# Patient Record
Sex: Female | Born: 1970 | Race: White | Hispanic: No | Marital: Married | Smoking: Never smoker
Health system: Southern US, Community
[De-identification: ages and names within clinical notes are randomized; demographics above are authoritative.]

---

## 2014-01-25 ENCOUNTER — Ambulatory Visit: Payer: Self-pay | Admitting: Obstetrics & Gynecology

## 2014-01-25 LAB — COMPREHENSIVE METABOLIC PANEL
ALT: 24 U/L
ANION GAP: 8 (ref 7–16)
Albumin: 3.2 g/dL — ABNORMAL LOW (ref 3.4–5.0)
Alkaline Phosphatase: 80 U/L
BILIRUBIN TOTAL: 0.3 mg/dL (ref 0.2–1.0)
BUN: 11 mg/dL (ref 7–18)
CALCIUM: 9.1 mg/dL (ref 8.5–10.1)
CO2: 30 mmol/L (ref 21–32)
CREATININE: 0.97 mg/dL (ref 0.60–1.30)
Chloride: 102 mmol/L (ref 98–107)
EGFR (Non-African Amer.): >60
Glucose: 104 mg/dL — ABNORMAL HIGH (ref 65–99)
OSMOLALITY: 279 (ref 275–301)
Potassium: 3.5 mmol/L (ref 3.5–5.1)
SGOT(AST): 19 U/L (ref 15–37)
Sodium: 140 mmol/L (ref 136–145)
Total Protein: 6.8 g/dL (ref 6.4–8.2)

## 2014-01-25 LAB — CBC
HCT: 39 % (ref 35.0–47.0)
HGB: 12.7 g/dL (ref 12.0–16.0)
MCH: 30.8 pg (ref 26.0–34.0)
MCHC: 32.6 g/dL (ref 32.0–36.0)
MCV: 94 fL (ref 80–100)
PLATELETS: 429 10*3/uL (ref 150–440)
RBC: 4.13 10*6/uL (ref 3.80–5.20)
RDW: 13.7 % (ref 11.5–14.5)
WBC: 9.4 10*3/uL (ref 3.6–11.0)

## 2014-01-25 LAB — LIPASE, BLOOD: Lipase: 175 U/L (ref 73–393)

## 2014-05-31 NOTE — Op Note (Signed)
PATIENT NAME:  Tara Ortega, [name] MR#:  [id number] DATE OF BIRTH:  10-29-1970  DATE OF PROCEDURE:  01/25/2014  PREOPERATIVE DIAGNOSIS:  Vaginal bleeding and vaginal cuff separation.  POSTOPERATIVE DIAGNOSIS:  Vaginal bleeding and vaginal cuff separation.  PROCEDURE: Exam under anesthesia and repair of vaginal cuff.   SURGEON:  Annamarie MajorPaul Sabrinna Yearwood, MD    ANESTHESIA: General.   ESTIMATED BLOOD LOSS: Minimal.   COMPLICATIONS: None.   FINDINGS: Mucosal separation at the vaginal apex with no evidence for fascial dehiscence.   DISPOSITION: To recovery room in stable condition.   TECHNIQUE: The patient is prepped and draped in the usual sterile fashion. After adequate anesthesia is obtained in the dorsal lithotomy position, speculum was placed and the vaginal cuff is identified. Sutures from prior surgery are seen in place. However, one seems to be overlapping vaginal tissues with bleeding from behind it as the vaginal tissues in this stitch area are not approximating the mucosal edges.  The stitch is cut and the defect in the back of the vaginal apex is visualized.  There is no fascial dehiscence or evisceration noted.  The vaginal cuff edges are approximated with a 0-Vicryl suture in a running locking fashion to completely close this defect. No bleeding is noted at the conclusion of this part of the procedure.  The vaginal cavity is irrigated with aspiration of fluid, and continued hemostasis is noted.  The patient goes to the recovery room in stable condition.  All sponge, instrument, and needle counts are correct.     ____________________________ R. Annamarie MajorPaul Cillian Gwinner, MD rph:DT D: 01/25/2014 13:28:08 ET T: 01/25/2014 14:18:45 ET JOB#: [id number]  cc: Dierdre Searles. Paul Srihan Brutus, MD, <Dictator> Nadara MustardOBERT P Berlene Dixson MD ELECTRONICALLY SIGNED 01/28/2014 9:38

## 2015-08-30 ENCOUNTER — Emergency Department: Payer: BLUE CROSS/BLUE SHIELD

## 2015-08-30 ENCOUNTER — Observation Stay
Admission: EM | Admit: 2015-08-30 | Discharge: 2015-08-31 | Disposition: A | Discharge status: Home / Self Care | Payer: BLUE CROSS/BLUE SHIELD | Attending: Internal Medicine | Admitting: Internal Medicine

## 2015-08-30 ENCOUNTER — Encounter: Payer: Self-pay | Admitting: Emergency Medicine

## 2015-08-30 DIAGNOSIS — Z9049 Acquired absence of other specified parts of digestive tract: Secondary | ICD-10-CM | POA: Diagnosis not present

## 2015-08-30 DIAGNOSIS — I10 Essential (primary) hypertension: Secondary | ICD-10-CM

## 2015-08-30 DIAGNOSIS — R079 Chest pain, unspecified: Principal | ICD-10-CM | POA: Diagnosis present

## 2015-08-30 DIAGNOSIS — E876 Hypokalemia: Secondary | ICD-10-CM

## 2015-08-30 DIAGNOSIS — E785 Hyperlipidemia, unspecified: Secondary | ICD-10-CM | POA: Insufficient documentation

## 2015-08-30 DIAGNOSIS — Z79899 Other long term (current) drug therapy: Secondary | ICD-10-CM | POA: Diagnosis not present

## 2015-08-30 DIAGNOSIS — Z6841 Body Mass Index (BMI) 40.0 and over, adult: Secondary | ICD-10-CM | POA: Diagnosis not present

## 2015-08-30 DIAGNOSIS — Z7982 Long term (current) use of aspirin: Secondary | ICD-10-CM | POA: Insufficient documentation

## 2015-08-30 DIAGNOSIS — D72829 Elevated white blood cell count, unspecified: Secondary | ICD-10-CM | POA: Diagnosis not present

## 2015-08-30 DIAGNOSIS — E669 Obesity, unspecified: Secondary | ICD-10-CM | POA: Diagnosis not present

## 2015-08-30 DIAGNOSIS — R51 Headache: Secondary | ICD-10-CM | POA: Insufficient documentation

## 2015-08-30 DIAGNOSIS — Z8249 Family history of ischemic heart disease and other diseases of the circulatory system: Secondary | ICD-10-CM | POA: Insufficient documentation

## 2015-08-30 DIAGNOSIS — R519 Headache, unspecified: Secondary | ICD-10-CM

## 2015-08-30 LAB — BASIC METABOLIC PANEL
ANION GAP: 9 (ref 5–15)
BUN: 10 mg/dL (ref 6–20)
CALCIUM: 9.7 mg/dL (ref 8.9–10.3)
CO2: 26 mmol/L (ref 22–32)
Chloride: 102 mmol/L (ref 101–111)
Creatinine, Ser: 0.9 mg/dL (ref 0.44–1.00)
GFR calc Af Amer: >60 mL/min (ref >60)
GLUCOSE: 113 mg/dL — AB (ref 65–99)
POTASSIUM: 3.3 mmol/L — AB (ref 3.5–5.1)
SODIUM: 137 mmol/L (ref 135–145)

## 2015-08-30 LAB — CBC
HEMATOCRIT: 38.9 % (ref 35.0–47.0)
HEMOGLOBIN: 13.3 g/dL (ref 12.0–16.0)
MCH: 31.1 pg (ref 26.0–34.0)
MCHC: 34.2 g/dL (ref 32.0–36.0)
MCV: 91.1 fL (ref 80.0–100.0)
Platelets: 399 10*3/uL (ref 150–440)
RBC: 4.27 MIL/uL (ref 3.80–5.20)
RDW: 15.3 % — AB (ref 11.5–14.5)
WBC: 11.3 10*3/uL — AB (ref 3.6–11.0)

## 2015-08-30 LAB — TROPONIN I

## 2015-08-30 MED ORDER — ACETAMINOPHEN 325 MG PO TABS
650.0000 mg | ORAL_TABLET | Freq: Once | ORAL | Status: AC
  Administered 2015-08-30: 650 mg via ORAL
  Filled 2015-08-30: qty 2

## 2015-08-30 NOTE — ED Notes (Signed)
Pt c/o of n/v beginning yesterday. Pt reports 1 whitish emesis at 1800 yesterday, and one this evening at approx 2000.  Pt c/o of left sided chest pain (now resolved) that radiated down left arm and neck with accompanying symptoms of dizziness/lightheadedess, weakness, SOB, N/V, diaphoresis. Pt describes the pain as squeezing and rated it as a 10 out of 10.   Pt reports hx of sharp shooting right-sided chest pain, however believes it to have been gas pain.

## 2015-08-30 NOTE — ED Notes (Signed)
Patient transported to X-ray 

## 2015-08-30 NOTE — ED Notes (Signed)
Pt reports she took 162 mg of aspirin at 2000

## 2015-08-30 NOTE — ED Provider Notes (Signed)
[facility] Emergency Department Provider Note   ____________________________________________  Time seen: Approximately 10:20 PM  I have reviewed the triage vital signs and the nursing notes.   HISTORY  Chief Complaint Chest Pain    HPI Tara Ortega is a 45 y.o. female with a history of hypertension presenting with left-sided chest pain. She s that the pain started at about 6:30 while she was cooking. She says the pain lasts about 15-20 minutes and was radiating up the left side of her neck. She became diaphoretic and also vomited. She denies smoking. Says that she does have a family history of heart disease. Took 2 baby aspirin prior to arrival this evening. Says had a similar episode yesterday. Has never had a stress test.has been taking Bactrim now for over a week without issue. Denies any abdominal pain.   No past medical history on file.  There are no active problems to display for this patient.   No past surgical history on file.  Current Outpatient Rx  . Order #: [id number] Class: Historical Med    Allergies Review of patient's allergies indicates no known allergies.  No family history on file.  Social History Social History  Substance Use Topics  . Smoking status: Not on file  . Smokeless tobacco: Not on file  . Alcohol use Not on file    Review of Systems Constitutional: No fever/chills Eyes: No visual changes. ENT: No sore throat. Cardiovascular: as above Respiratory: says has had increasing shortness of breath with exertion but says this may be from difficulty walking from the left heel spur surgery several weeks ago. Gastrointestinal: No abdominal pain.  No nausea, no vomiting.  No diarrhea.  No constipation. Genitourinary: Negative for dysuria. Musculoskeletal: Negative for back pain. Skin: Negative for rash. Neurological: Negative for headaches, focal weakness or numbness.  10-point ROS otherwise  negative.  ____________________________________________   PHYSICAL EXAM:  VITAL SIGNS: ED Triage Vitals  Enc Vitals Group     BP 08/30/15 2128 (!) 155/79     Pulse Rate 08/30/15 2128 81     Resp 08/30/15 2128 18     Temp 08/30/15 2128 97.9 F (36.6 C)     Temp Source 08/30/15 2128 Oral     SpO2 08/30/15 2128 98 %     Weight 08/30/15 2128 270 lb (122.5 kg)     Height 08/30/15 2128 5\' 8"  (1.727 m)     Head Circumference --      Peak Flow --      Pain Score 08/30/15 2130 8     Pain Loc --      Pain Edu? --      Excl. in GC? --     Constitutional: Alert and oriented. Well appearing and in no acute distress. Eyes: Conjunctivae are normal. PERRL. EOMI. Head: Atraumatic. Nose: No congestion/rhinnorhea. Mouth/Throat: Mucous membranes are moist. Neck: No stridor.   Cardiovascular: Normal rate, regular rhythm. Grossly normal heart sounds.  Good peripheral circulation with equal and bilateral radial as well as dorsalis pedis pulses. Chest pain is not reproducible to palpation. Respiratory: Normal respiratory effort.  No retractions. Lungs CTAB. Gastrointestinal: Soft and nontender. No distention. No abdominal bruits.  Musculoskeletal: No lower extremity tenderness nor edema.  No joint effusions. Neurologic:  Normal speech and language. No gross focal neurologic deficits are appreciated. Skin:  Skin is warm, dry and intact. No rash noted. Psychiatric: Mood and affect are normal. Speech and behavior are normal.  ____________________________________________   LABS (all  labs ordered are listed, but only abnormal results are displayed)  Labs Reviewed  BASIC METABOLIC PANEL - Abnormal; Notable for the following:       Result Value   Potassium 3.3 (*)    Glucose, Bld 113 (*)    All other components within normal limits  CBC - Abnormal; Notable for the following:    WBC 11.3 (*)    RDW 15.3 (*)    All other components within normal limits  TROPONIN I    ____________________________________________  EKG  ED ECG REPORT I, Arelia Longest, the attending physician, personally viewed and interpreted this ECG.   Date: 08/30/2015  EKG Time: 2121  Rate: 77  Rhythm: normal sinus rhythm  Axis: normal axis  Intervals:none  ST&T Change: no ST segment elevation or depression. T-wave inversions in3 and aVF as well as V3 nd V4.  ____________________________________________  RADIOLOGY  CLINICAL DATA:  45 year old female with chest pain EXAM: CHEST  2 VIEW COMPARISON:  None. FINDINGS: The heart size and mediastinal contours are within normal limits. Both lungs are clear. The visualized skeletal structures are unremarkable. Right upper quadrant cholecystectomy clips. IMPRESSION: No active cardiopulmonary disease. Electronically Signed   By: Elgie Collard M.D.   On: 08/30/2015 22:01 ____________________________________________   PROCEDURES    Procedures    ____________________________________________   INITIAL IMPRESSION / ASSESSMENT AND PLAN / ED COURSE  Pertinent labs & imaging results that were available during my care of the patient were reviewed by me and considered in my medical decision making (see chart for details).  Patient chest pain-free at this time. Says she has a headache that is diffusebut started after the episode. Patient with a heart score of 4. PERC negative.  Discussed plan with patient and she would prefer to be admitted for observation and possible further testing. ____________________________________________   FINAL CLINICAL IMPRESSION(S) / ED DIAGNOSES  Chest pain.    NEW MEDICATIONS STARTED DURING THIS VISIT:  New Prescriptions   No medications on file     Note:  This document was prepared using Dragon voice recognition software and may include unintentional dictation errors.    Myrna Blazer, MD 08/30/15 2232

## 2015-08-30 NOTE — ED Triage Notes (Signed)
Pt states that she was cooking and started having center chest pain that radiated up the side of her neck and is now in her head. Pt also states that she had ankle surgery for bone spurs recently and states that it is infected, she is currently taking a sulfa antibiotic and has a follow up appt tomorrow

## 2015-08-31 ENCOUNTER — Observation Stay: Payer: BLUE CROSS/BLUE SHIELD

## 2015-08-31 DIAGNOSIS — R51 Headache: Secondary | ICD-10-CM

## 2015-08-31 DIAGNOSIS — E876 Hypokalemia: Secondary | ICD-10-CM

## 2015-08-31 DIAGNOSIS — I10 Essential (primary) hypertension: Secondary | ICD-10-CM

## 2015-08-31 DIAGNOSIS — E669 Obesity, unspecified: Secondary | ICD-10-CM

## 2015-08-31 DIAGNOSIS — E785 Hyperlipidemia, unspecified: Secondary | ICD-10-CM

## 2015-08-31 DIAGNOSIS — D72829 Elevated white blood cell count, unspecified: Secondary | ICD-10-CM

## 2015-08-31 DIAGNOSIS — R519 Headache, unspecified: Secondary | ICD-10-CM

## 2015-08-31 LAB — LIPID PANEL
Cholesterol: 184 mg/dL (ref 0–200)
HDL: 41 mg/dL (ref >40)
LDL CALC: 107 mg/dL — AB (ref 0–99)
Total CHOL/HDL Ratio: 4.5 RATIO
Triglycerides: 178 mg/dL — ABNORMAL HIGH (ref <150)
VLDL: 36 mg/dL (ref 0–40)

## 2015-08-31 LAB — NM MYOCAR MULTI W/SPECT W/WALL MOTION / EF
CSEPED: 1 min
CSEPEDS: 8 s
Estimated workload: 1 METS
Peak HR: 129 {beats}/min
Rest HR: 76 {beats}/min

## 2015-08-31 LAB — TROPONIN I
Troponin I: <0.03 ng/mL (ref <0.03)
Troponin I: <0.03 ng/mL (ref <0.03)
Troponin I: <0.03 ng/mL (ref <0.03)

## 2015-08-31 LAB — POTASSIUM: Potassium: 3.7 mmol/L (ref 3.5–5.1)

## 2015-08-31 LAB — FIBRIN DERIVATIVES D-DIMER (ARMC ONLY): Fibrin derivatives D-dimer (ARMC): 215 (ref 0–499)

## 2015-08-31 LAB — TSH: TSH: 3.936 u[IU]/mL (ref 0.350–4.500)

## 2015-08-31 LAB — HEMOGLOBIN A1C: HEMOGLOBIN A1C: 5.4 % (ref 4.0–6.0)

## 2015-08-31 LAB — MAGNESIUM: Magnesium: 1.9 mg/dL (ref 1.7–2.4)

## 2015-08-31 MED ORDER — ACETAMINOPHEN 325 MG PO TABS
650.0000 mg | ORAL_TABLET | ORAL | Status: DC | PRN
  Administered 2015-08-31: 650 mg via ORAL
  Filled 2015-08-31: qty 2

## 2015-08-31 MED ORDER — TECHNETIUM TC 99M TETROFOSMIN IV KIT
32.2300 | PACK | Freq: Once | INTRAVENOUS | Status: AC | PRN
  Administered 2015-08-31: 32.23 via INTRAVENOUS

## 2015-08-31 MED ORDER — SULFAMETHOXAZOLE-TRIMETHOPRIM 800-160 MG PO TABS
1.0000 | ORAL_TABLET | Freq: Two times a day (BID) | ORAL | Status: DC
  Administered 2015-08-31 (×2): 1 via ORAL
  Filled 2015-08-31 (×2): qty 1

## 2015-08-31 MED ORDER — POTASSIUM CHLORIDE CRYS ER 20 MEQ PO TBCR
40.0000 meq | EXTENDED_RELEASE_TABLET | Freq: Two times a day (BID) | ORAL | Status: AC
  Administered 2015-08-31 (×2): 40 meq via ORAL
  Filled 2015-08-31 (×2): qty 2

## 2015-08-31 MED ORDER — MORPHINE SULFATE (PF) 2 MG/ML IV SOLN: 2.0000 mg | INTRAVENOUS | Status: DC | PRN

## 2015-08-31 MED ORDER — AMLODIPINE BESYLATE 5 MG PO TABS: 5.0000 mg | ORAL_TABLET | Freq: Every day | ORAL | Status: DC

## 2015-08-31 MED ORDER — REGADENOSON 0.4 MG/5ML IV SOLN
0.4000 mg | Freq: Once | INTRAVENOUS | Status: AC
  Administered 2015-08-31: 0.4 mg via INTRAVENOUS

## 2015-08-31 MED ORDER — ONDANSETRON HCL 4 MG/2ML IJ SOLN: 4.0000 mg | Freq: Four times a day (QID) | INTRAMUSCULAR | Status: DC | PRN

## 2015-08-31 MED ORDER — TECHNETIUM TC 99M TETROFOSMIN IV KIT
13.5700 | PACK | Freq: Once | INTRAVENOUS | Status: AC | PRN
  Administered 2015-08-31: 13.67 via INTRAVENOUS

## 2015-08-31 MED ORDER — AMLODIPINE BESYLATE 10 MG PO TABS: 10.0000 mg | ORAL_TABLET | Freq: Every day | ORAL | 5 refills | Status: DC

## 2015-08-31 MED ORDER — ASPIRIN EC 81 MG PO TBEC
81.0000 mg | DELAYED_RELEASE_TABLET | Freq: Every day | ORAL | Status: DC
  Administered 2015-08-31: 81 mg via ORAL
  Filled 2015-08-31: qty 1

## 2015-08-31 MED ORDER — ZOLPIDEM TARTRATE 5 MG PO TABS: 5.0000 mg | ORAL_TABLET | Freq: Every evening | ORAL | Status: DC | PRN

## 2015-08-31 MED ORDER — SODIUM CHLORIDE 0.9% FLUSH
3.0000 mL | Freq: Two times a day (BID) | INTRAVENOUS | Status: DC
  Administered 2015-08-31 (×2): 3 mL via INTRAVENOUS

## 2015-08-31 MED ORDER — FLUOXETINE HCL 20 MG PO CAPS
40.0000 mg | ORAL_CAPSULE | Freq: Every day | ORAL | Status: DC
  Administered 2015-08-31: 40 mg via ORAL
  Filled 2015-08-31: qty 2

## 2015-08-31 MED ORDER — HYDROCHLOROTHIAZIDE 25 MG PO TABS
25.0000 mg | ORAL_TABLET | Freq: Every day | ORAL | Status: DC
  Administered 2015-08-31: 25 mg via ORAL
  Filled 2015-08-31: qty 1

## 2015-08-31 MED ORDER — AMLODIPINE BESYLATE 10 MG PO TABS
10.0000 mg | ORAL_TABLET | Freq: Every day | ORAL | Status: DC
  Administered 2015-08-31: 10 mg via ORAL
  Filled 2015-08-31: qty 1

## 2015-08-31 MED ORDER — ALPRAZOLAM 0.25 MG PO TABS: 0.2500 mg | ORAL_TABLET | Freq: Two times a day (BID) | ORAL | Status: DC | PRN

## 2015-08-31 NOTE — Consult Note (Signed)
[facility] CLINIC CARDIOLOGY [facility]  CARDIOLOGY CONSULT NOTE  Patient ID: Tara Ortega MRN: [id number] DOB/AGE: [birth year] 45 y.o.  Admit date: 08/30/2015 Referring Physician Dr. Winona Legato Primary Physician   Primary Cardiologist None Reason for Consultation chest pain  HPI: Pt is a 45 yo female with no significant prior cardiac history admitted with complaints of chesst pain. She has ruled out for an mi. She states she had 2 episodes of chest pain lasting 15-20 minutes while she was cooking. Associated with diaphoresis, nausea and vomiting. She had a previous sinilar episode on the right side of her body earlier. She is taking bactrim currenly. She underwent a functional study this am which was normal. She has a normal ef. She is on amlodipine and hctz for hypertension. She has a family histgory of cad. D dimer was normal.   Review of Systems  HENT: Negative.   Eyes: Negative.   Respiratory: Negative.   Cardiovascular: Positive for chest pain.  Gastrointestinal: Positive for nausea and vomiting.  Genitourinary: Negative.   Musculoskeletal: Negative.   Skin: Negative.   Neurological: Positive for weakness.  Endo/Heme/Allergies: Negative.   Psychiatric/Behavioral: Negative.     History reviewed. No pertinent past medical history.  History reviewed. No pertinent family history.  Social History   Social History  . Marital status: Married    Spouse name: N/A  . Number of children: N/A  . Years of education: N/A   Occupational History  . Not on file.   Social History Main Topics  . Smoking status: Never Smoker  . Smokeless tobacco: Never Used  . Alcohol use No  . Drug use: No  . Sexual activity: Not on file   Other Topics Concern  . Not on file   Social History Narrative  . No narrative on file    History reviewed. No pertinent surgical history.   Prescriptions Prior to Admission  Medication Sig Dispense Refill Last Dose  . amLODipine (NORVASC) 5  MG tablet Take 5 mg by mouth daily.   08/30/2015 at Unknown time  . FLUoxetine (PROZAC) 40 MG capsule Take 40 mg by mouth daily.   08/30/2015 at Unknown time  . hydrochlorothiazide (HYDRODIURIL) 25 MG tablet Take 25 mg by mouth daily.   08/30/2015 at Unknown time  . sulfamethoxazole-trimethoprim (BACTRIM DS,SEPTRA DS) 800-160 MG tablet Take 1 tablet by mouth 2 (two) times daily.   08/30/2015 at Unknown time    Physical Exam: Blood pressure (!) 144/78, pulse 78, temperature 98.4 F (36.9 C), temperature source Oral, resp. rate 18, height 5\' 8"  (1.727 m), weight 122.5 kg (270 lb), SpO2 97 %.   Wt Readings from Last 1 Encounters:  08/30/15 122.5 kg (270 lb)     General appearance: alert and cooperative Head: Normocephalic, without obvious abnormality, atraumatic Resp: clear to auscultation bilaterally Chest wall: no tenderness Cardio: regular rate and rhythm, S1, S2 normal, no murmur, click, rub or gallop GI: soft, non-tender; bowel sounds normal; no masses,  no organomegaly Extremities: extremities normal, atraumatic, no cyanosis or edema Neurologic: Grossly normal  Labs:   Lab Results  Component Value Date   WBC 11.3 (H) 08/30/2015   HGB 13.3 08/30/2015   HCT 38.9 08/30/2015   MCV 91.1 08/30/2015   PLT 399 08/30/2015    Recent Labs Lab 08/30/15 2130 08/31/15 1248  NA 137  --   K 3.3* 3.7  CL 102  --   CO2 26  --   BUN 10  --   CREATININE  0.90  --   CALCIUM 9.7  --   GLUCOSE 113*  --    Lab Results  Component Value Date   TROPONINI <0.03 08/31/2015      Radiology: no acute cardiopulmonary disease EKG: nsr with no ischemia  ASSESSMENT AND PLAN:  Pt with recent admission for chest pain with both typcial and atypical features who has ruled out for an mi. EKG is unremarkable. Funcitonal study appears normal. D dimer was normal. Does not appear to have acs. Would continue with amlodipine 10 mg daily and hctz. Currently stable but still with chest pain. Does not apeart to  require furhter inpatient cardiac workup. Ambulate.  Signed: Dalia Heading MD, [facility] 08/31/2015, 2:26 PM

## 2015-08-31 NOTE — Discharge Summary (Addendum)
[facility] [place] [facility]   PATIENT NAME: Tara Ortega    MR#:  [id number]  DATE OF BIRTH:  12-10-1970  DATE OF ADMISSION:  08/30/2015 ADMITTING PHYSICIAN: Delfino Lovett, MD  DATE OF DISCHARGE: 08/31/2015  4:16 PM  PRIMARY CARE PHYSICIAN: Belva Agee, NP     ADMISSION DIAGNOSIS:  Chest pain, unspecified chest pain type [R07.9]  DISCHARGE DIAGNOSIS:  Principal Problem:   Chest pain, rule out acute myocardial infarction Active Problems:   Benign essential HTN   Hypokalemia   Hyperlipidemia   Obesity   Headache   Leukocytosis   SECONDARY DIAGNOSIS:  History reviewed. No pertinent past medical history.  .pro HOSPITAL COURSE:   The patient is 45 year old female with PMH of essential hypertension, who presented with cheat pain, headache. Her EKG revealed T inversions in III, aVF, V3, V4. The patient was admitted and underwent stress test, which was normal. She was also seen by cardiologist and no further work up was recommended. The patient's blood pressure was poorly controled, so Norvasc was advanced to 10 mg dose. She was also recommended to get sleep stuydy done to r/o OSA. She was felt to be stable to be discharged home. BNP was normal.  Discussion by problem: 1. Chest pain, could likely be related to poorly conrtoled hyeprtsnkion, Myoview was negative, BNP was  Normal, the patient was  not hypoxic. 2. Headache, again, likely HTN related, follow, also get sleep study if no improvement to r/o OSA and initiate patient on diamox for possible pseudotumor cerebri if all negative . 3. Hypokalemia, resolved with PO supplementation 4. Obesity, TSH was normal, hgb a1c 5.4, no diabetes, Lipid panel showed triglycerides of 178, LDL107, needs to loose weight  5. Leukocytosis, no obvious infection, continue home antibiotics as per prior recommendations 6 essential HTn, advanced norvasc to 10 mg dose, continue HCTZ, follow ans advance as  needed DISCHARGE CONDITIONS:   stable  CONSULTS OBTAINED:  Treatment Team:  Dalia Heading, MD  DRUG ALLERGIES:  No Known Allergies  DISCHARGE MEDICATIONS:   Discharge Medication List as of 08/31/2015  3:50 PM    CONTINUE these medications which have CHANGED   Details  amLODipine (NORVASC) 10 MG tablet Take 1 tablet (10 mg total) by mouth daily., Starting Mon 08/31/2015, Normal      CONTINUE these medications which have NOT CHANGED   Details  FLUoxetine (PROZAC) 40 MG capsule Take 40 mg by mouth daily., Starting Thu 07/16/2015, Historical Med    hydrochlorothiazide (HYDRODIURIL) 25 MG tablet Take 25 mg by mouth daily., Starting Thu 07/16/2015, Historical Med    sulfamethoxazole-trimethoprim (BACTRIM DS,SEPTRA DS) 800-160 MG tablet Take 1 tablet by mouth 2 (two) times daily., Historical Med      STOP taking these medications     aspirin 81 MG tablet          DISCHARGE INSTRUCTIONS:    The patient is to follow up with PCP  If you experience worsening of your admission symptoms, develop shortness of breath, life threatening emergency, suicidal or homicidal thoughts you must seek medical attention immediately by calling 911 or calling your MD immediately  if symptoms less severe.  You Must read complete instructions/literature along with all the possible adverse reactions/side effects for all the Medicines you take and that have been prescribed to you. Take any new Medicines after you have completely understood and accept all the possible adverse reactions/side effects.   Please note  You were cared for  by a hospitalist during your hospital stay. If you have any questions about your discharge medications or the care you received while you were in the hospital after you are discharged, you can call the unit and asked to speak with the hospitalist on call if the hospitalist that took care of you is not available. Once you are discharged, your primary care physician will handle  any further medical issues. Please note that NO REFILLS for any discharge medications will be authorized once you are discharged, as it is imperative that you return to your primary care physician (or establish a relationship with a primary care physician if you do not have one) for your aftercare needs so that they can reassess your need for medications and monitor your lab values.    Today   CHIEF COMPLAINT:   Chief Complaint  Patient presents with  . Chest Pain    HISTORY OF PRESENT ILLNESS:  Tara Ortega  is a 45 y.o. female with a known history of essential hypertension, who presented with cheat pain, headache. Her EKG revealed T inversions in III, aVF, V3, V4. The patient was admitted and underwent stress test, which was normal. She was also seen by cardiologist and no further work up was recommended. The patient's blood pressure was poorly controled, so Norvasc was advanced to 10 mg dose. She was also recommended to get sleep stuydy done to r/o OSA. She was felt to be stable to be discharged home. BNP was normal.  Discussion by problem: 1. Chest pain, could likely be related to poorly conrtoled hyeprtsnkion, Myoview was negative, BNP was  Normal, the patient was  not hypoxic. 2. Headache, again, likely HTN related, follow, also get sleep study if no improvement to r/o OSA and initiate patient on diamox for possible pseudotumor cerebri if all negative . 3. Hypokalemia, resolved with PO supplementation 4. Obesity, TSH was normal, hgb a1c 5.4, no diabetes,  Lipid panel showed triglycerides of 178, LDL107, needs to loose weight 5. Leukocytosis, no obvious infection, continue home antibiotics as per prior recommendations 6 essential HTn, advanced norvasc to 10 mg dose, continue HCTZ, follow ans advance as needed  VITAL SIGNS:  Blood pressure (!) 144/78, pulse 78, temperature 98.4 F (36.9 C), temperature source Oral, resp. rate 18, height 5\' 8"  (1.727 m), weight 122.5 kg (270 lb), SpO2  97 %.  I/O:   Intake/Output Summary (Last 24 hours) at 08/31/15 1632 Last data filed at 08/31/15 0022  Gross per 24 hour  Intake                0 ml  Output              300 ml  Net             -300 ml    PHYSICAL EXAMINATION:  GENERAL:  45 y.o.-year-old patient lying in the bed with no acute distress.  EYES: Pupils equal, round, reactive to light and accommodation. No scleral icterus. Extraocular muscles intact.  HEENT: Head atraumatic, normocephalic. Oropharynx and nasopharynx clear.  NECK:  Supple, no jugular venous distention. No thyroid enlargement, no tenderness.  LUNGS: Normal breath sounds bilaterally, no wheezing, rales,rhonchi or crepitation. No use of accessory muscles of respiration.  CARDIOVASCULAR: S1, S2 normal. No murmurs, rubs, or gallops.  ABDOMEN: Soft, non-tender, non-distended. Bowel sounds present. No organomegaly or mass.  EXTREMITIES: No pedal edema, cyanosis, or clubbing.  NEUROLOGIC: Cranial nerves II through XII are intact. Muscle strength 5/5 in all extremities. Sensation  intact. Gait not checked.  PSYCHIATRIC: The patient is alert and oriented x 3.  SKIN: No obvious rash, lesion, or ulcer.   DATA REVIEW:   CBC  Recent Labs Lab 08/30/15 2130  WBC 11.3*  HGB 13.3  HCT 38.9  PLT 399    Chemistries   Recent Labs Lab 08/30/15 2130 08/31/15 0351 08/31/15 1248  NA 137  --   --   K 3.3*  --  3.7  CL 102  --   --   CO2 26  --   --   GLUCOSE 113*  --   --   BUN 10  --   --   CREATININE 0.90  --   --   CALCIUM 9.7  --   --   MG  --  1.9  --     Cardiac Enzymes  Recent Labs Lab 08/31/15 0643  TROPONINI <0.03    Microbiology Results  No results found for this or any previous visit.  RADIOLOGY:  Dg Chest 2 View  Result Date: 08/30/2015 CLINICAL DATA:  45 year old female with chest pain EXAM: CHEST  2 VIEW COMPARISON:  None. FINDINGS: The heart size and mediastinal contours are within normal limits. Both lungs are clear. The  visualized skeletal structures are unremarkable. Right upper quadrant cholecystectomy clips. IMPRESSION: No active cardiopulmonary disease. Electronically Signed   By: Elgie Collard M.D.   On: 08/30/2015 22:01  Nm Myocar Multi W/spect W/wall Motion / Ef  Result Date: 08/31/2015 Dalia Heading, MD     08/31/2015  2:49 PM Stress sestimibi showed no evidence of ischemia. Normal lv function.    EKG:   Orders placed or performed during the hospital encounter of 08/30/15  . EKG 12-Lead  . EKG 12-Lead  . ED EKG within 10 minutes  . ED EKG within 10 minutes  . EKG 12-Lead (at 6am)  . EKG 12-Lead (at 6am)      Management plans discussed with the patient, family and they are in agreement.  CODE STATUS:     Code Status Orders        Start     Ordered   08/31/15 0019  Full code  Continuous     08/31/15 0018    Code Status History    Date Active Date Inactive Code Status Order ID Comments User Context   This patient has a current code status but no historical code status.      TOTAL TIME TAKING CARE OF THIS PATIENT: 40 minutes.    Katharina Caper M.D on 08/31/2015 at 4:32 PM  Between 7am to 6pm - Pager - [phone]  After 6pm go to [email or web address] - password [email or web address] [facility]  [facility]  Office  [phone]  CC: Primary care physician; Belva Agee, NP

## 2015-08-31 NOTE — Procedures (Signed)
Stress sestimibi showed no evidence of ischemia. Normal lv function.

## 2015-08-31 NOTE — ED Notes (Signed)
Pt had plane flight from [place] on Thursday

## 2015-08-31 NOTE — Progress Notes (Signed)
No reports of chest pain.  No respiratory distress noted with ambulation to bathroom.  Complaint of headache improved with Tylenol

## 2015-08-31 NOTE — H&P (Addendum)
Tara Ortega is an 45 y.o. female.   Chief Complaint: Chest Pain HPI: Tara Ortega is a 45 y.o. female with a history of hypertension presenting with 2 episodes of squeezing left-sided chest pain that started about 6:30pm and lasted 15-20 minutes while she was cooking. Pain was associated with diaphoresis, nausea, vomiting x1 and shortness of breath. She says "it feels like someone was squeezing my heart muscle." Took 2 baby aspirin prior to arrival this evening. Says had a similar episode yesterday and has had similar pain on the right side in the past.  Of note, she flew home from [place] on a 5 hour flight 3 days ago and noted swelling and pain in the left ankle which resolved.  She is taking Bactrim now for a left foot infection for over a week without issue.  Has never had a cardiac workup, echo, stress test.. Denies any abdominal pain.  PMH: HTN  No past surgical history on file.  Fam History: Mother with CHF, Father died of cancer, also had CABGx3 vessel.   Social History: Nonsmoker, nondrinker.   Allergies: No Known Allergies  Medications Prior to Admission  Medication Sig Dispense Refill  . amLODipine (NORVASC) 5 MG tablet Take 5 mg by mouth daily.    Marland Kitchen FLUoxetine (PROZAC) 40 MG capsule Take 40 mg by mouth daily.    . hydrochlorothiazide (HYDRODIURIL) 25 MG tablet Take 25 mg by mouth daily.    Marland Kitchen sulfamethoxazole-trimethoprim (BACTRIM DS,SEPTRA DS) 800-160 MG tablet Take 1 tablet by mouth 2 (two) times daily.      Results for orders placed or performed during the hospital encounter of 08/30/15 (from the past 48 hour(s))  Basic metabolic panel     Status: Abnormal   Collection Time: 08/30/15  9:30 PM  Result Value Ref Range   Sodium 137 135 - 145 mmol/L   Potassium 3.3 (L) 3.5 - 5.1 mmol/L   Chloride 102 101 - 111 mmol/L   CO2 26 22 - 32 mmol/L   Glucose, Bld 113 (H) 65 - 99 mg/dL   BUN 10 6 - 20 mg/dL   Creatinine, Ser 0.90 0.44 - 1.00 mg/dL   Calcium 9.7 8.9 -  10.3 mg/dL   GFR calc non Af Amer >60 >60 mL/min   GFR calc Af Amer >60 >60 mL/min    Comment: (NOTE) The eGFR has been calculated using the CKD EPI equation. This calculation has not been validated in all clinical situations. eGFR's persistently <60 mL/min signify possible Chronic Kidney Disease.    Anion gap 9 5 - 15  CBC     Status: Abnormal   Collection Time: 08/30/15  9:30 PM  Result Value Ref Range   WBC 11.3 (H) 3.6 - 11.0 K/uL   RBC 4.27 3.80 - 5.20 MIL/uL   Hemoglobin 13.3 12.0 - 16.0 g/dL   HCT 38.9 35.0 - 47.0 %   MCV 91.1 80.0 - 100.0 fL   MCH 31.1 26.0 - 34.0 pg   MCHC 34.2 32.0 - 36.0 g/dL   RDW 15.3 (H) 11.5 - 14.5 %   Platelets 399 150 - 440 K/uL  Troponin I     Status: None   Collection Time: 08/30/15  9:30 PM  Result Value Ref Range   Troponin I <0.03 <0.03 ng/mL   Dg Chest 2 View  Result Date: 08/30/2015 CLINICAL DATA:  45 year old female with chest pain EXAM: CHEST  2 VIEW COMPARISON:  None. FINDINGS: The heart size and mediastinal contours are within normal  limits. Both lungs are clear. The visualized skeletal structures are unremarkable. Right upper quadrant cholecystectomy clips. IMPRESSION: No active cardiopulmonary disease. Electronically Signed   By: Anner Crete M.D.   On: 08/30/2015 22:01   ROS CONSTITUTIONAL: No fever/chills. No fatigue, weakness. No weight gain, no weight loss.  EYES: No blurry or double vision.  ENT: No tinnitus. No postnasal drip. No redness or soreness of the oropharynx.  RESPIRATORY: No cough, no wheeze, no hemoptysis. No dyspnea.  CARDIOVASCULAR: POSITIVE CHEST PAIN. No orthopnea. No palpitations. No syncope.  GASTROINTESTINAL: POSITIVE NAUSEA & VOMITING. No constipation or diarrhea. No abdominal pain. No melena or hematochezia.  GENITOURINARY: No dysuria or hematuria.  ENDOCRINE: No polyuria or nocturia. No heat or cold intolerance.  HEMATOLOGY: No anemia. No bruising. No bleeding.  INTEGUMENTARY: No rashes. No  lesions.  MUSCULOSKELETAL: POSITIVE LLE SWELLING. No arthritis.  No gout.  NEUROLOGIC: No numbness, tingling, weakness or ataxia. No seizure-type activity.  PSYCHIATRIC: No anxiety. No depression. No insomnia.   Blood pressure (!) 147/76, pulse 80, temperature 98.3 F (36.8 C), temperature source Oral, resp. rate 16, height 5' 8"  (1.727 m), weight 122.5 kg (270 lb), SpO2 97 %.   Physical Exam  GENERAL: 45 year old white female patient, well-developed, well-nourished lying in the bed in no acute distress. Mildly anxious. EYES: Pupils equal, round, reactive to light and accommodation. No scleral icterus. Extraocular muscles intact.  HEENT: Head atraumatic, normocephalic. Oropharynx and nasopharynx clear. Mucus membranes moist.  NECK: Supple, full range of motion. No jugular venous distention. No thyroid enlargement, no tenderness, no lymphadenopathy.  CHEST: Normal breath sounds bilaterally, no wheezing, rales, rhonchi or crepitation. No use of accessory muscles of respiration. No reproducible chest wall tenderness.  CARDIOVASCULAR: S1, S2 normal. No murmurs, rubs, or gallops. Cap refill <2 seconds. ABDOMEN: Soft, nontender, nondistended. No rebound, guarding, rigidity. Normoactive bowel sounds present in all four quadrants. No organomegaly or mass.  EXTREMITIES: Full range of motion. No pedal edema, cyanosis, or clubbing.  NEUROLOGIC: Cranial nerves II through XII are grossly intact with no focal sensorimotor deficit. Muscle strength 5/5 in all extremities. Sensation intact. Gait not checked.  PSYCHIATRIC: The patient is alert and oriented x 3. Normal affect, mood, thought content.  SKIN: Warm, dry, and intact without obvious rash, lesion, or ulcer.    EKG: Sinus with T wave inversions in III, aVF, V3 and V4.   Assessment/Plan This is a 45 year old white female with h/o HTN now admitted with  1. Chest pain, rule out ACS - Admit to telemetry for monitoring, serial troponins, lipids, TSH,  cardio consult given EKG changes, family history and presentation. Given recent flight from [place] we will also check a D-Dimer and pursue imaging to rule out VTE if positive.   2. Mild hypokalemia - replace PO.   3. HTN - Continue home medications.  4. Cardiac diet, routine DVT px.  5. Plan of care discussed with the patient, husband, and children at bedside. She expresses understanding of her condition and agrees with plan of care.    [employer], DO 08/31/2015, 12:53 AM

## 2015-08-31 NOTE — ED Notes (Signed)
Called floor to let them know pt on the way 

## 2015-11-19 DIAGNOSIS — R600 Localized edema: Secondary | ICD-10-CM | POA: Insufficient documentation

## 2015-11-19 DIAGNOSIS — F419 Anxiety disorder, unspecified: Secondary | ICD-10-CM | POA: Insufficient documentation

## 2015-11-19 DIAGNOSIS — F32A Depression, unspecified: Secondary | ICD-10-CM | POA: Insufficient documentation

## 2019-07-17 ENCOUNTER — Ambulatory Visit (INDEPENDENT_AMBULATORY_CARE_PROVIDER_SITE_OTHER): Payer: BC Managed Care – PPO

## 2019-07-17 ENCOUNTER — Ambulatory Visit (INDEPENDENT_AMBULATORY_CARE_PROVIDER_SITE_OTHER): Payer: BC Managed Care – PPO | Admitting: Podiatry

## 2019-07-17 ENCOUNTER — Other Ambulatory Visit: Payer: Self-pay

## 2019-07-17 ENCOUNTER — Encounter: Payer: Self-pay | Admitting: Podiatry

## 2019-07-17 ENCOUNTER — Other Ambulatory Visit: Payer: Self-pay | Admitting: Podiatry

## 2019-07-17 DIAGNOSIS — B351 Tinea unguium: Secondary | ICD-10-CM

## 2019-07-17 DIAGNOSIS — M7661 Achilles tendinitis, right leg: Secondary | ICD-10-CM

## 2019-07-17 DIAGNOSIS — M79671 Pain in right foot: Secondary | ICD-10-CM

## 2019-07-17 NOTE — Progress Notes (Signed)
Subjective:   Patient ID: Tara Ortega, female   DOB: 49 y.o.   MRN: [id number]   HPI Patient states she is had a lot of pain in the back of her right heel and had a history of surgery on her left one approximately 5 years ago with what she thinks is the same problem.  Patient is coming in a little bit earlier on this 1 and states it is been present for approximately 8 months and she is obese which is complicating factor does not smoke likes to be active   Review of Systems  All other systems reviewed and are negative.       Objective:  Physical Exam Vitals and nursing note reviewed.  Constitutional:      Appearance: She is well-developed.  Pulmonary:     Effort: Pulmonary effort is normal.  Musculoskeletal:        General: Normal range of motion.  Skin:    General: Skin is warm.  Neurological:     Mental Status: She is alert.     Neurovascular status intact muscle strength found to be adequate range of motion within normal limits with significant equinus condition right over left.  Patient has exquisite discomfort on the posterior lateral aspect of the heel and mild in the central and medial aspect of the heel.  The left shows a scar on the medial side with no indications of other pathology except for enlargement of bone but no pain.  Patient has good digital perfusion well oriented x3     Assessment:  Acute Achilles tendinitis right with inflammation at the insertion mostly lateral slightly central medial side of the insertion     Plan:  H&P reviewed condition and we want to try conservative care before considering surgery due to the extent of recovery and overall health.  I explained injection along with immobilization explaining risk and patient is willing to accept risk.  I did a careful injection of the lateral side 3 mg Dexasone Kenalog keep it away from the central medial I applied a air fracture walker with instructions on usage and patient will be seen back 3 weeks  or earlier if needed.  Patient encouraged to reduced activity over that time  X-rays indicate that there is spurring of the posterior aspect of the right heel of a mild to moderate nature

## 2019-07-17 NOTE — Patient Instructions (Signed)

## 2019-08-07 ENCOUNTER — Other Ambulatory Visit: Payer: Self-pay

## 2019-08-07 ENCOUNTER — Ambulatory Visit (INDEPENDENT_AMBULATORY_CARE_PROVIDER_SITE_OTHER): Payer: BC Managed Care – PPO | Admitting: Podiatry

## 2019-08-07 ENCOUNTER — Encounter: Payer: Self-pay | Admitting: Podiatry

## 2019-08-07 VITALS — Temp 98.2°F

## 2019-08-07 DIAGNOSIS — M7661 Achilles tendinitis, right leg: Secondary | ICD-10-CM

## 2019-08-07 NOTE — Progress Notes (Signed)
Subjective:   Patient ID: Tara Ortega, female   DOB: 49 y.o.   MRN: [id number]   HPI Patient states doing quite a bit better but still having mild discomfort if she does a lot.  Understands obesity is part of the problem she is experiencing    ROS      Objective:  Physical Exam  Neurovascular status intact with patient found to have discomfort in the right posterior heel still present but quite a bit improved from previous visit with inflammation only upon deep palpation     Assessment:  Achilles tendinitis right with inflammation improving with pain still present upon deep palpation     Plan:  H&P reviewed condition recommended continued physical therapy anti-inflammatories ice therapy and gradual reduction of the boot.  Patient is discharged and we did discuss possibility for surgical intervention in future if it should flareup or other modalities or treatments we could consider

## 2019-12-16 ENCOUNTER — Other Ambulatory Visit: Payer: Self-pay

## 2019-12-16 ENCOUNTER — Ambulatory Visit (INDEPENDENT_AMBULATORY_CARE_PROVIDER_SITE_OTHER): Payer: BC Managed Care – PPO

## 2019-12-16 ENCOUNTER — Ambulatory Visit (INDEPENDENT_AMBULATORY_CARE_PROVIDER_SITE_OTHER): Payer: BC Managed Care – PPO | Admitting: Podiatry

## 2019-12-16 ENCOUNTER — Encounter: Payer: Self-pay | Admitting: Podiatry

## 2019-12-16 DIAGNOSIS — M7661 Achilles tendinitis, right leg: Secondary | ICD-10-CM

## 2019-12-16 MED ORDER — MELOXICAM 15 MG PO TABS: 15.0000 mg | ORAL_TABLET | Freq: Every day | ORAL | 2 refills | Status: DC

## 2019-12-16 NOTE — Progress Notes (Signed)
Subjective:   Patient ID: Tara Ortega, female   DOB: 49 y.o.   MRN: [id number]   HPI patient presents stating my heel has really been bothering me and I may need to have surgery like I had on the other one.  States that it got better for a very short period of time but it seems like it is hurting on the other side.  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  At this point I am going to try a lateral injection I did explain the risk of doing this and that I want her to wear the boot and that the chances of rupture always exist.  She is willing to accept risk to try to avoid surgery and I did sterile prep and carefully injected the lateral side stayed away from the Achilles tendon itself 3 mg dexamethasone 5 mg Xylocaine reviewed her x-ray and discussed possibility for surgical intervention in this case.  Reappoint 1 month to reevaluate  X-ray indicates there is slight increase in the size of the spur on the posterior aspect right heel

## 2020-01-16 ENCOUNTER — Ambulatory Visit: Payer: BC Managed Care – PPO | Admitting: Podiatry

## 2020-03-06 ENCOUNTER — Other Ambulatory Visit: Payer: Self-pay | Admitting: Podiatry

## 2020-03-06 NOTE — Telephone Encounter (Signed)
Please advise 

## 2020-04-16 ENCOUNTER — Other Ambulatory Visit: Payer: Self-pay | Admitting: Family

## 2020-04-16 DIAGNOSIS — Q438 Other specified congenital malformations of intestine: Secondary | ICD-10-CM

## 2020-04-23 ENCOUNTER — Ambulatory Visit (INDEPENDENT_AMBULATORY_CARE_PROVIDER_SITE_OTHER): Payer: BC Managed Care – PPO | Admitting: Podiatry

## 2020-04-23 ENCOUNTER — Encounter: Payer: Self-pay | Admitting: Podiatry

## 2020-04-23 ENCOUNTER — Other Ambulatory Visit: Payer: Self-pay

## 2020-04-23 DIAGNOSIS — M7661 Achilles tendinitis, right leg: Secondary | ICD-10-CM

## 2020-04-23 MED ORDER — TRIAMCINOLONE ACETONIDE 10 MG/ML IJ SUSP
10.0000 mg | Freq: Once | INTRAMUSCULAR | Status: AC
  Administered 2020-04-23: 10 mg

## 2020-04-26 NOTE — Progress Notes (Signed)
Subjective:   Patient ID: Tara Ortega, female   DOB: 50 y.o.   MRN: [id number]   HPI Patient presents stating that the back of the right heel has been very sore and it was only better for a number of months after I treated it last time.  States that she knows she is can need surgery like her other heel but she cannot do it right now as her husband has just had back surgery done and she needs to take care of him neuro   ROS      Objective:  Physical Exam  Neurovascular status intact with severe posterior Achilles tendinitis that is worse at the insertion into the back of the heel bone lateral side with the central and medial also involved not quite as much degree     Assessment:  Significant Achilles tendinitis right with large bone spur formation posterior with history of having had the left 1 fixed     Plan:  H&P reviewed condition.  She goes between here in [place] and I am going to have Dr. Lilian Kapur see her and I did introduced them today.  I am going to try to temporarily help her and I went ahead did sterile prep and injected the posterior lateral aspect of the heel 3 mg dexamethasone Kenalog 5 mg Xylocaine keeping away from the center medial side and did explain chances for rupture associated with that procedure

## 2020-05-18 ENCOUNTER — Ambulatory Visit
Admission: RE | Admit: 2020-05-18 | Discharge: 2020-05-18 | Disposition: A | Discharge status: Home / Self Care | Payer: BC Managed Care – PPO | Source: Ambulatory Visit | Attending: Family | Admitting: Family

## 2020-05-18 DIAGNOSIS — Q438 Other specified congenital malformations of intestine: Secondary | ICD-10-CM

## 2020-06-10 ENCOUNTER — Other Ambulatory Visit: Payer: Self-pay | Admitting: Podiatry

## 2020-06-10 NOTE — Telephone Encounter (Signed)
Please advise 

## 2020-07-15 DIAGNOSIS — K5730 Diverticulosis of large intestine without perforation or abscess without bleeding: Secondary | ICD-10-CM | POA: Insufficient documentation

## 2020-10-07 ENCOUNTER — Ambulatory Visit (INDEPENDENT_AMBULATORY_CARE_PROVIDER_SITE_OTHER): Payer: BC Managed Care – PPO

## 2020-10-07 ENCOUNTER — Other Ambulatory Visit: Payer: Self-pay

## 2020-10-07 ENCOUNTER — Ambulatory Visit (INDEPENDENT_AMBULATORY_CARE_PROVIDER_SITE_OTHER): Payer: BC Managed Care – PPO | Admitting: Podiatry

## 2020-10-07 DIAGNOSIS — T148XXA Other injury of unspecified body region, initial encounter: Secondary | ICD-10-CM | POA: Diagnosis not present

## 2020-10-07 DIAGNOSIS — M7661 Achilles tendinitis, right leg: Secondary | ICD-10-CM | POA: Diagnosis not present

## 2020-10-07 MED ORDER — AMOXICILLIN-POT CLAVULANATE 875-125 MG PO TABS: 1.0000 | ORAL_TABLET | Freq: Two times a day (BID) | ORAL | 0 refills | Status: AC

## 2020-10-07 NOTE — Progress Notes (Signed)
Subjective:   Patient ID: Tara Ortega, female   DOB: 50 y.o.   MRN: [id number]   HPI Patient states she seemed like she was doing reasonably well and then she traumatized the back of her right Achilles the beginning of July and has struggled for the last 7 weeks with this.  She is in the last 3 weeks developed a fissure in the bottom of the right heel and now she is getting some swelling and discomfort that has intensified   ROS      Objective:  Physical Exam  Patient who has had chronic Achilles tendinitis who may have a partial tear of the Achilles or due to the fissure which developed the possibility for low-grade localized infection with no active drainage     Assessment:  The fissure measures approximately 4 cm in length by half centimeter in width localized no subcutaneous bone or tendon exposure with possibility for tear of the Achilles tendon secondary to trauma.  Did advise her that if any increased redness swelling were to occur that she may need to go to the emergency room and eventually may need IV antibiotics     Plan:  H&P reviewed condition and as precautionary measure placed her on antibiotic Augmentin twice daily soaks and offloading.  I then applied sterile dressing to the area and due to the severe trauma she is experienced right I did send for MRI.  X-ray indicates that there is the possibility that there may have been some disruption of the spur in a more proximal direction and hopefully the MRI will help [place] with this

## 2020-10-23 ENCOUNTER — Ambulatory Visit
Admission: RE | Admit: 2020-10-23 | Discharge: 2020-10-23 | Disposition: A | Discharge status: Home / Self Care | Payer: BC Managed Care – PPO | Source: Ambulatory Visit | Attending: Podiatry | Admitting: Podiatry

## 2020-10-23 ENCOUNTER — Other Ambulatory Visit: Payer: Self-pay

## 2020-10-23 DIAGNOSIS — M7661 Achilles tendinitis, right leg: Secondary | ICD-10-CM

## 2020-10-23 DIAGNOSIS — T148XXA Other injury of unspecified body region, initial encounter: Secondary | ICD-10-CM

## 2020-10-26 NOTE — Progress Notes (Signed)
Needs to be seen asap

## 2020-11-05 ENCOUNTER — Ambulatory Visit (INDEPENDENT_AMBULATORY_CARE_PROVIDER_SITE_OTHER): Payer: BC Managed Care – PPO | Admitting: Podiatry

## 2020-11-05 ENCOUNTER — Other Ambulatory Visit: Payer: Self-pay

## 2020-11-05 DIAGNOSIS — S86011D Strain of right Achilles tendon, subsequent encounter: Secondary | ICD-10-CM | POA: Diagnosis not present

## 2020-11-05 NOTE — Patient Instructions (Signed)

## 2020-11-06 ENCOUNTER — Telehealth: Payer: Self-pay | Admitting: Urology

## 2020-11-06 NOTE — Telephone Encounter (Signed)
DOS - 12/02/20   REPAIR ACHILLE TENDON RIGHT --- 76226 GASTROCNEMIUS RECESS RIGHT --- 33354 CALCANEAL OSTECTOMY RIGHT --- 56256   [employer] EFFECTIVE DATE - 02/07/17   PLAN DEDUCTIBLE - $1,450.00 W/ $0.00 REMAINING OUT OF POCKET - $2,650.00 W/ $1,024.00 REMAINING COINSURANCE - 10% COPAY - $0.00    SPOKE WITH SARA WITH [employer] AND SHE STATED THAT FOR CPT CODES 38937, [phone] AND [phone] NO PRIOR AUTH IS REQUIRED.   REF # I - [id number]

## 2020-11-06 NOTE — Progress Notes (Signed)
Subjective: 50 year old female presents the office today for Achilles tendon rupture.  She was seen by Dr. Charlsie Merles for this.  She states that she has had chronic Achilles tendinitis about 2 years and she had a bone spur.  She states that after an injury where she tripped on a rug and she had increased pain.  MRI was performed which did reveal a rupture of the Achilles tendon off the attachment.  She developed a skin fissure along the heel but she is been treating daily and has been getting better.  No swelling or redness or any drainage.  She presents today for surgical evaluation.  Objective: AAO x3, NAD-presents today wearing regular shoes DP/PT pulses palpable bilaterally, CRT less than 3 seconds Protective sensation intact with Simms Weinstein monofilament Small skin fissure present posterior heel with hyperkeratotic tissue.  No drainage or pus.  No edema, erythema.  Appears to be superficial or any signs of infection.  There is edema present to the posterior aspect heel.  There is decreased dorsiflexion, plantarflexion and tenderness in the distal portion of the Achilles tendon.  Thompson test is positive. No pain with calf compression, swelling, warmth, erythema  Assessment: 50 year old female with Achilles tendon rupture  Plan: -All treatment options discussed with the patient including all alternatives, risks, complications.  -Reviewed the MRI with her.  We discussed both conservative and surgical options.  I do recommend surgical intervention.  I discussed that hopefully we can reattach the Achilles tendon to the heel and also gastrocnemius recession.  If needed may need to consider FHL transfer but I think we can do a direct repair to the calcaneus.  I discussed the surgery is postoperative course and she agrees to proceed.  I will either do the surgery soon as possible but she is not able to do it for couple of weeks.  Discussed the longer that we wait the more difficult it could be to repair  most likely.  Also she is wearing a regular shoe I need her to be in the boot ultimately been nonweightbearing but she cannot do that as well and discussed this.  I dispensed heel lift for her shoe as well as a Tri-Lock ankle brace to at least try to help. -The incision placement as well as the postoperative course was discussed with the patient. I discussed risks of the surgery which include, but not limited to, infection, bleeding, pain, swelling, need for further surgery, delayed or nonhealing, painful or ugly scar, numbness or sensation changes, over/under correction, recurrence, transfer lesions, further deformity, hardware failure, DVT/PE, loss of toe/foot. Patient understands these risks and wishes to proceed with surgery. The surgical consent was reviewed with the patient all 3 pages were signed. No promises or guarantees were given to the outcome of the procedure. All questions were answered to the best of my ability. Before the surgery the patient was encouraged to call the office if there is any further questions. The surgery will be performed at the [facility] on an outpatient basis.  Vivi Barrack DPM

## 2020-11-12 DIAGNOSIS — M79676 Pain in unspecified toe(s): Secondary | ICD-10-CM

## 2020-12-02 ENCOUNTER — Other Ambulatory Visit: Payer: Self-pay | Admitting: Podiatry

## 2020-12-02 ENCOUNTER — Encounter: Payer: Self-pay | Admitting: Podiatry

## 2020-12-02 DIAGNOSIS — M7661 Achilles tendinitis, right leg: Secondary | ICD-10-CM | POA: Diagnosis not present

## 2020-12-02 DIAGNOSIS — M216X1 Other acquired deformities of right foot: Secondary | ICD-10-CM | POA: Diagnosis not present

## 2020-12-02 MED ORDER — PROMETHAZINE HCL 25 MG PO TABS: 25.0000 mg | ORAL_TABLET | Freq: Three times a day (TID) | ORAL | 0 refills | Status: AC | PRN

## 2020-12-02 MED ORDER — OXYCODONE-ACETAMINOPHEN 5-325 MG PO TABS: 1.0000 | ORAL_TABLET | Freq: Four times a day (QID) | ORAL | 0 refills | Status: AC | PRN

## 2020-12-02 MED ORDER — CEPHALEXIN 500 MG PO CAPS: 500.0000 mg | ORAL_CAPSULE | Freq: Three times a day (TID) | ORAL | 0 refills | Status: AC

## 2020-12-02 NOTE — Progress Notes (Signed)
Postop medications sent 

## 2020-12-07 ENCOUNTER — Ambulatory Visit (INDEPENDENT_AMBULATORY_CARE_PROVIDER_SITE_OTHER): Payer: BC Managed Care – PPO | Admitting: Podiatry

## 2020-12-07 ENCOUNTER — Ambulatory Visit (INDEPENDENT_AMBULATORY_CARE_PROVIDER_SITE_OTHER): Payer: BC Managed Care – PPO

## 2020-12-07 ENCOUNTER — Other Ambulatory Visit: Payer: Self-pay

## 2020-12-07 DIAGNOSIS — M7731 Calcaneal spur, right foot: Secondary | ICD-10-CM

## 2020-12-07 DIAGNOSIS — S86011D Strain of right Achilles tendon, subsequent encounter: Secondary | ICD-10-CM

## 2020-12-07 DIAGNOSIS — Z9889 Other specified postprocedural states: Secondary | ICD-10-CM

## 2020-12-08 NOTE — Progress Notes (Signed)
Subjective: Tara Ortega is a 50 y.o. is seen today in office s/p right foot gastrocnemius recession, repair of Achilles tendon, heel spur resection preformed on 12/02/2020.  She said that she not really had any discomfort.  She took 1 pain pill the day of surgery but since then no narcotics.  She is been nonweightbearing.  Denies any systemic complaints such as fevers, chills, nausea, vomiting. No calf pain, chest pain, shortness of breath.   Objective: General: No acute distress, AAOx3  DP/PT pulses palpable 2/4, CRT < 3 sec to all digits.  Protective sensation intact. Motor function intact.  Right foot: Incision is well coapted without any evidence of dehiscence sutures, staples intact. There is no surrounding erythema, ascending cellulitis, fluctuance, crepitus, malodor, drainage/purulence. There is mild edema around the surgical site. There is no significant pain along the surgical site.  Minimal bruising present. No other areas of tenderness to bilateral lower extremities.  No other open lesions or pre-ulcerative lesions.  No pain with calf compression, swelling, warmth, erythema.   Assessment and Plan:  Status post right Achilles tendon repair, doing well with no complications   -Treatment options discussed including all alternatives, risks, and complications -X-rays obtained reviewed.  Status post posterior superior resection of the calcaneus.  No evidence of acute fracture.  Drill holes are noted from the anchors. -Dressing was changed.  Small amount of antibiotic ointment was applied followed by dressing.  A well-padded below-knee fiberglass cast was applied make sure to pad all bony prominences and slight gravity equinus.  Continue nonweightbearing. -Ice/elevation -Pain medication as needed. -Monitor for any clinical signs or symptoms of infection and DVT/PE and directed to call the office immediately should any occur or go to the ER. -Follow-up as scheduled for cast change and  possible suture removal or sooner if any problems arise. In the meantime, encouraged to call the office with any questions, concerns, change in symptoms.   Ovid Curd, DPM

## 2020-12-17 ENCOUNTER — Ambulatory Visit (INDEPENDENT_AMBULATORY_CARE_PROVIDER_SITE_OTHER): Payer: BC Managed Care – PPO | Admitting: Podiatry

## 2020-12-17 ENCOUNTER — Encounter: Payer: BC Managed Care – PPO | Admitting: Podiatry

## 2020-12-17 ENCOUNTER — Other Ambulatory Visit: Payer: Self-pay

## 2020-12-17 DIAGNOSIS — M7731 Calcaneal spur, right foot: Secondary | ICD-10-CM | POA: Diagnosis not present

## 2020-12-17 DIAGNOSIS — S86011D Strain of right Achilles tendon, subsequent encounter: Secondary | ICD-10-CM

## 2020-12-20 NOTE — Progress Notes (Signed)
Subjective: Tara Ortega is a 50 y.o. is seen today in office s/p right foot gastrocnemius recession, repair of Achilles tendon, heel spur resection preformed on 12/02/2020.  States that she has been doing well she still not having any pain.  She presents today for cast change and suture removal.  Denies any fevers or chills.  No calf pain, chest pain or shortness of breath.  No other concerns.   Objective: General: No acute distress, AAOx3  DP/PT pulses palpable 2/4, CRT < 3 sec to all digits.  Protective sensation intact. Motor function intact.  Right foot: Cast was removed.  Incision is well coapted without any evidence of dehiscence sutures, staples intact.  No surrounding erythema, drainage or pus or any signs of infection.  Appears that the incisions are healing well.  No tenderness to palpation.  States that she is happy that the bump is gone. No pain with calf compression, swelling, warmth, erythema.   Assessment and Plan:  Status post right Achilles tendon repair, doing well with no complications   -Treatment options discussed including all alternatives, risks, and complications -Cast was changed today.  I remove the sutures but left the staples intact.  A small amount of antibiotic ointment and a bandage applied.  A well-padded below-knee fiberglass cast was applied making sure to pad all bony prominences and the foot was held more at 90 degrees today.  Continue nonweightbearing, ice and elevation.  Next appointment cast change, staple removal   Vivi Barrack DPM

## 2020-12-29 ENCOUNTER — Ambulatory Visit (INDEPENDENT_AMBULATORY_CARE_PROVIDER_SITE_OTHER): Payer: BC Managed Care – PPO | Admitting: Podiatry

## 2020-12-29 ENCOUNTER — Encounter: Payer: BC Managed Care – PPO | Admitting: Podiatry

## 2020-12-29 ENCOUNTER — Other Ambulatory Visit: Payer: Self-pay

## 2020-12-29 DIAGNOSIS — S86011D Strain of right Achilles tendon, subsequent encounter: Secondary | ICD-10-CM

## 2020-12-29 DIAGNOSIS — M7731 Calcaneal spur, right foot: Secondary | ICD-10-CM

## 2021-01-05 ENCOUNTER — Encounter: Payer: Self-pay | Admitting: Podiatry

## 2021-01-05 ENCOUNTER — Other Ambulatory Visit: Payer: Self-pay

## 2021-01-05 ENCOUNTER — Ambulatory Visit (INDEPENDENT_AMBULATORY_CARE_PROVIDER_SITE_OTHER): Payer: BC Managed Care – PPO | Admitting: Podiatry

## 2021-01-05 DIAGNOSIS — M7731 Calcaneal spur, right foot: Secondary | ICD-10-CM

## 2021-01-05 DIAGNOSIS — S86011D Strain of right Achilles tendon, subsequent encounter: Secondary | ICD-10-CM

## 2021-01-05 NOTE — Progress Notes (Signed)
Subjective: Tara Ortega is a 50 y.o. is seen today in office s/p right foot gastrocnemius recession, repair of Achilles tendon, heel spur resection preformed on 12/02/2020.  She presents today for cast change as well as staple removal.  She said that she has been doing well and not having any significant pain.  She is still nonweightbearing.  She denies any fevers or chills.  No nausea or vomiting.  No calf pain, chest pain or shortness of breath.    Objective: General: No acute distress, AAOx3  DP/PT pulses palpable 2/4, CRT < 3 sec to all digits.  Protective sensation intact. Motor function intact.  Right foot: Cast was removed.  Incision is well coapted without any evidence of dehiscence staples intact.  No surrounding erythema, drainage or pus or any signs of infection.  Appears that the incisions are healing well without any signs of infection or dehiscence.  No tenderness to palpation.  States that she is happy that the bump is gone. No pain with calf compression, swelling, warmth, erythema.   Assessment and Plan:  Status post right Achilles tendon repair, doing well with no complications   -Treatment options discussed including all alternatives, risks, and complications -Cast was changed today.  Staples removed incisions healing well.  Small amount of antibiotic ointment and a bandage was applied.  A well-padded below-knee fiberglass cast was applied making sure to pad all bony prominences. -Continue nonweightbearing. -Continue ice elevate. -Monitor for any clinical signs or symptoms of infection and directed to call the office immediately should any occur or go to the ER.  Next appointment cast removal and transition to cam boot but still remain nonweightbearing  Vivi Barrack DPM

## 2021-01-06 NOTE — Progress Notes (Signed)
Subjective: Tara Ortega is a 50 y.o. is seen today in office s/p right foot gastrocnemius recession, repair of Achilles tendon, heel spur resection preformed on 12/02/2020.  She presents today for cast change, removal.  She said that she is continue to do well and not having any discomfort.  She is been nonweightbearing.  No recent injuries she reports.  No fevers or chills or any other concerns.   Objective: General: No acute distress, AAOx3  DP/PT pulses palpable 2/4, CRT < 3 sec to all digits.  Protective sensation intact. Motor function intact.  Right foot: Cast was removed.  Some scabbing is present along the incision site but overall the incisions coapted without any signs of dehiscence.  Slight edema there is no erythema or warmth to the skin surrounding erythema, ascending cellulitis.  No drainage or pus or any signs of infection.  No tenderness palpation.  Clinically the tendon appears to be intact and able to palpate the borders of it. No pain with calf compression, swelling, warmth, erythema.   Assessment and Plan:  Status post right Achilles tendon repair, doing well with no complications   -Treatment options discussed including all alternatives, risks, and complications -Cast was removed today.  She was placed into a cam boot but she is still nonweightbearing.  Continue to ice and elevate. -Monitor for any clinical signs or symptoms of infection and directed to call the office immediately should any occur or go to the ER.  Vivi Barrack DPM

## 2021-01-19 ENCOUNTER — Ambulatory Visit (INDEPENDENT_AMBULATORY_CARE_PROVIDER_SITE_OTHER): Payer: BC Managed Care – PPO | Admitting: Podiatry

## 2021-01-19 ENCOUNTER — Other Ambulatory Visit: Payer: Self-pay

## 2021-01-19 DIAGNOSIS — S86011D Strain of right Achilles tendon, subsequent encounter: Secondary | ICD-10-CM

## 2021-01-19 DIAGNOSIS — M7731 Calcaneal spur, right foot: Secondary | ICD-10-CM

## 2021-01-21 ENCOUNTER — Telehealth: Payer: Self-pay | Admitting: *Deleted

## 2021-01-21 ENCOUNTER — Other Ambulatory Visit: Payer: Self-pay | Admitting: *Deleted

## 2021-01-21 NOTE — Telephone Encounter (Signed)
[facility] w/ [facility] is calling for a referral for patient and the protocol, patient is at office for her first visit. Please advise.

## 2021-01-22 NOTE — Telephone Encounter (Signed)
Resent referral to [facility] on [address]

## 2021-01-22 NOTE — Progress Notes (Signed)
Subjective: Tara Ortega is a 50 y.o. is seen today in office s/p right foot gastrocnemius recession, repair of Achilles tendon, heel spur resection preformed on 12/02/2020.  States that she has been doing well.  Still not having any discomfort.  No numbness or significant swelling.  No fevers or chills that she reports.  She has no other concerns.   Objective: General: No acute distress, AAOx3  DP/PT pulses palpable 2/4, CRT < 3 sec to all digits.  Protective sensation intact. Motor function intact.  Right foot: Cast was removed.  Incisions well coapted without any signs of dehiscence and scar is well formed.  There is slight edema there is no erythema or warmth.  There is no clinical signs of infection.  Clinically the Achilles tendon appears to be intact.  There is no pain on the calcaneus.  No other areas of discomfort identified.   No pain with calf compression, swelling, warmth, erythema.   Assessment and Plan:  Status post right Achilles tendon repair, doing well with no complications   -Treatment options discussed including all alternatives, risks, and complications -At this point we discussed starting physical therapy and she can start a transition to partial weightbearing as tolerated in the cam boot as she progresses with physical therapy.  Continue ice and elevate and compression help with any postoperative edema. -Referral to [facility]  Vivi Barrack DPM

## 2021-01-25 ENCOUNTER — Other Ambulatory Visit: Payer: Self-pay | Admitting: *Deleted

## 2021-01-27 NOTE — Telephone Encounter (Signed)
Called and left message for call back for the  status of the referral sent.

## 2021-02-05 ENCOUNTER — Ambulatory Visit (INDEPENDENT_AMBULATORY_CARE_PROVIDER_SITE_OTHER): Payer: BC Managed Care – PPO | Admitting: Podiatry

## 2021-02-05 ENCOUNTER — Other Ambulatory Visit: Payer: Self-pay

## 2021-02-05 DIAGNOSIS — S86011D Strain of right Achilles tendon, subsequent encounter: Secondary | ICD-10-CM

## 2021-02-05 DIAGNOSIS — Z9889 Other specified postprocedural states: Secondary | ICD-10-CM

## 2021-02-05 DIAGNOSIS — M7731 Calcaneal spur, right foot: Secondary | ICD-10-CM

## 2021-02-08 NOTE — Progress Notes (Signed)
Subjective: Tara Ortega is a 51 y.o. is seen today in office s/p right foot gastrocnemius recession, repair of Achilles tendon, heel spur resection preformed on 12/02/2020.  States that she has been feeling great.  Still not having any significant pain.  She is doing physical therapy which has been helpful.  Denies any fevers or chills.  She has no other concerns today.   Objective: General: No acute distress, AAOx3  DP/PT pulses palpable 2/4, CRT < 3 sec to all digits.  Protective sensation intact. Motor function intact.  Right foot: Presents today wearing cam boot.  Incisions well coapted without any signs of dehiscence and scar is well formed.  Trace edema.  There is no erythema or warmth.  Flexor, extensor tendons and in particular the Achilles tendon appear to be intact.  No pain on exam.  MMT 5/5. No pain with calf compression, swelling, warmth, erythema.   Assessment and Plan:  Status post right Achilles tendon repair, doing well with no complications   -Treatment options discussed including all alternatives, risks, and complications -She is continues to make good progress.  She is still in the cam boot.  Discussed with her she can start transition to regular shoe as tolerated physical therapy can gradually return to her regular shoe as able.   When she does get into regular shoes to wear shoe with good arch support at all times while in the boot. -Continue ice elevate.  *We will extend FMLA 2 weeks past her return to work date.  Trula Slade DPM

## 2021-03-08 ENCOUNTER — Ambulatory Visit (INDEPENDENT_AMBULATORY_CARE_PROVIDER_SITE_OTHER): Payer: BC Managed Care – PPO | Admitting: Podiatry

## 2021-03-08 ENCOUNTER — Other Ambulatory Visit: Payer: Self-pay

## 2021-03-08 ENCOUNTER — Encounter: Payer: Self-pay | Admitting: Podiatry

## 2021-03-08 DIAGNOSIS — L603 Nail dystrophy: Secondary | ICD-10-CM

## 2021-03-08 DIAGNOSIS — B351 Tinea unguium: Secondary | ICD-10-CM | POA: Diagnosis not present

## 2021-03-08 DIAGNOSIS — S86011D Strain of right Achilles tendon, subsequent encounter: Secondary | ICD-10-CM | POA: Diagnosis not present

## 2021-03-08 NOTE — Progress Notes (Signed)
Subjective: Tara Ortega is a 51 y.o. is seen today in office s/p right foot gastrocnemius recession, repair of Achilles tendon, heel spur resection preformed on 12/02/2020.  She states that she has been doing great.  She is back to doing normal activities that any issues.  She even went to the beach over the weekend and walked on the soft sand without any pain.  She is still in the home exercises and has been discharged in physical therapy.   Concerned that her right big toenail appears thicker and discolored.  She had some bruising of the nail from when she was in a cast and jammed her toe but even prior to that the nail was turning colors.  The nail does get tender as it grows out.  No swelling redness or drainage.  Objective: General: No acute distress, AAOx3  DP/PT pulses palpable 2/4, CRT < 3 sec to all digits.  Protective sensation intact. Motor function intact.  Right foot: Presents today wearing regular shoe.  Incisions are well-healed.  There is no tenderness palpation on the heel posteriorly or along the surgical sites.  No pain about the first the calcaneus.  No area of tenderness.  MMT 5/5.   Right hallux toenails hypertrophic, dystrophic with yellow, white discoloration.  No edema, erythema or signs of infection. No pain with calf compression, swelling, warmth, erythema.   Assessment and Plan:  Status post right Achilles tendon repair, doing well with no complications; right hallux onychomycosis  -Treatment options discussed including all alternatives, risks, and complications -From a surgical perspective she is doing well and she is having no issues and back to regular activities.  She has completed physical therapy.  At this point I will discharge her from the postoperative care and she agrees with this plan.  Note was provided to return to work tomorrow without restrictions.  However I discussed with her if there is any changes or concerns to let me know and I will be happy to  see her back for the surgery. -For the right hallux toenail sharply debrided the nail with any complications or bleeding I sent this for culture, pathology to [facility].   Vivi Barrack DPM

## 2022-07-11 ENCOUNTER — Ambulatory Visit (INDEPENDENT_AMBULATORY_CARE_PROVIDER_SITE_OTHER): Payer: BC Managed Care – PPO

## 2022-07-11 ENCOUNTER — Ambulatory Visit (INDEPENDENT_AMBULATORY_CARE_PROVIDER_SITE_OTHER): Payer: BC Managed Care – PPO | Admitting: Podiatry

## 2022-07-11 DIAGNOSIS — M7731 Calcaneal spur, right foot: Secondary | ICD-10-CM | POA: Diagnosis not present

## 2022-07-11 DIAGNOSIS — B351 Tinea unguium: Secondary | ICD-10-CM | POA: Diagnosis not present

## 2022-07-11 DIAGNOSIS — M7989 Other specified soft tissue disorders: Secondary | ICD-10-CM | POA: Diagnosis not present

## 2022-07-11 MED ORDER — EFINACONAZOLE 10 % EX SOLN: 1.0000 [drp] | Freq: Every day | CUTANEOUS | 11 refills | Status: AC

## 2022-07-11 NOTE — Progress Notes (Unsigned)
Tara Ortega at [facility]

## 2022-07-11 NOTE — Patient Instructions (Signed)

## 2022-08-05 ENCOUNTER — Telehealth: Payer: Self-pay | Admitting: Podiatry

## 2022-08-05 NOTE — Telephone Encounter (Signed)
I called patient to go over blood work. Will continue topical medication due to elevated liver enzymes. Awaiting u/s of heel. She is going to call them to see status.

## 2022-08-31 ENCOUNTER — Encounter: Payer: Self-pay | Admitting: Vascular Surgery

## 2022-10-27 ENCOUNTER — Ambulatory Visit
Admission: RE | Admit: 2022-10-27 | Discharge: 2022-10-27 | Disposition: A | Discharge status: Home / Self Care | Payer: BC Managed Care – PPO | Source: Ambulatory Visit | Attending: Podiatry | Admitting: Podiatry

## 2022-10-27 DIAGNOSIS — M7989 Other specified soft tissue disorders: Secondary | ICD-10-CM
